# Patient Record
Sex: Male | Born: 1995 | Race: Black or African American | Hispanic: No | Marital: Single | Smoking: Never smoker
Health system: Southern US, Community
[De-identification: ages and names within clinical notes are randomized; demographics above are authoritative.]

## PROBLEM LIST (undated history)

## (undated) DIAGNOSIS — Z789 Other specified health status: Secondary | ICD-10-CM

## (undated) DIAGNOSIS — Z8489 Family history of other specified conditions: Secondary | ICD-10-CM

## (undated) HISTORY — PX: WISDOM TOOTH EXTRACTION: SHX21

---

## 2017-07-11 ENCOUNTER — Inpatient Hospital Stay (HOSPITAL_COMMUNITY)
Admission: EM | Admit: 2017-07-11 | Discharge: 2017-07-13 | DRG: 340 | Disposition: A | Discharge status: Home / Self Care | Payer: Self-pay | Attending: General Surgery | Admitting: General Surgery

## 2017-07-11 ENCOUNTER — Encounter (HOSPITAL_COMMUNITY): Admission: EM | Disposition: A | Discharge status: Home / Self Care | Payer: Self-pay | Source: Home / Self Care

## 2017-07-11 ENCOUNTER — Encounter (HOSPITAL_COMMUNITY): Payer: Self-pay | Admitting: *Deleted

## 2017-07-11 ENCOUNTER — Emergency Department (HOSPITAL_COMMUNITY): Payer: Self-pay | Admitting: Anesthesiology

## 2017-07-11 ENCOUNTER — Emergency Department (HOSPITAL_COMMUNITY): Payer: Self-pay

## 2017-07-11 DIAGNOSIS — R1031 Right lower quadrant pain: Secondary | ICD-10-CM

## 2017-07-11 DIAGNOSIS — K3532 Acute appendicitis with perforation and localized peritonitis, without abscess: Principal | ICD-10-CM | POA: Diagnosis present

## 2017-07-11 DIAGNOSIS — D729 Disorder of white blood cells, unspecified: Secondary | ICD-10-CM

## 2017-07-11 DIAGNOSIS — R509 Fever, unspecified: Secondary | ICD-10-CM

## 2017-07-11 DIAGNOSIS — D734 Cyst of spleen: Secondary | ICD-10-CM | POA: Diagnosis present

## 2017-07-11 DIAGNOSIS — R000 Tachycardia, unspecified: Secondary | ICD-10-CM | POA: Diagnosis present

## 2017-07-11 DIAGNOSIS — K3580 Unspecified acute appendicitis: Secondary | ICD-10-CM

## 2017-07-11 DIAGNOSIS — K3531 Acute appendicitis with localized peritonitis and gangrene, without perforation: Secondary | ICD-10-CM | POA: Diagnosis present

## 2017-07-11 DIAGNOSIS — R112 Nausea with vomiting, unspecified: Secondary | ICD-10-CM

## 2017-07-11 HISTORY — DX: Family history of other specified conditions: Z84.89

## 2017-07-11 HISTORY — PX: LAPAROSCOPIC APPENDECTOMY: SHX408

## 2017-07-11 HISTORY — DX: Other specified health status: Z78.9

## 2017-07-11 LAB — COMPREHENSIVE METABOLIC PANEL
ALT: 27 U/L (ref 17–63)
AST: 19 U/L (ref 15–41)
Albumin: 4.8 g/dL (ref 3.5–5.0)
Alkaline Phosphatase: 42 U/L (ref 38–126)
Anion gap: 11 (ref 5–15)
BUN: 9 mg/dL (ref 6–20)
CHLORIDE: 100 mmol/L — AB (ref 101–111)
CO2: 22 mmol/L (ref 22–32)
Calcium: 9.4 mg/dL (ref 8.9–10.3)
Creatinine, Ser: 0.79 mg/dL (ref 0.61–1.24)
Glucose, Bld: 133 mg/dL — ABNORMAL HIGH (ref 65–99)
POTASSIUM: 3.8 mmol/L (ref 3.5–5.1)
Sodium: 133 mmol/L — ABNORMAL LOW (ref 135–145)
Total Bilirubin: 2.4 mg/dL — ABNORMAL HIGH (ref 0.3–1.2)
Total Protein: 8 g/dL (ref 6.5–8.1)

## 2017-07-11 LAB — CBC WITH DIFFERENTIAL/PLATELET
Basophils Absolute: 0 10*3/uL (ref 0.0–0.1)
Basophils Relative: 0 %
Eosinophils Absolute: 0 10*3/uL (ref 0.0–0.7)
Eosinophils Relative: 0 %
HEMATOCRIT: 45.7 % (ref 39.0–52.0)
HEMOGLOBIN: 16 g/dL (ref 13.0–17.0)
LYMPHS PCT: 2 %
Lymphs Abs: 0.3 10*3/uL — ABNORMAL LOW (ref 0.7–4.0)
MCH: 29.6 pg (ref 26.0–34.0)
MCHC: 35 g/dL (ref 30.0–36.0)
MCV: 84.6 fL (ref 78.0–100.0)
Monocytes Absolute: 1.2 10*3/uL — ABNORMAL HIGH (ref 0.1–1.0)
Monocytes Relative: 7 %
NEUTROS PCT: 91 %
Neutro Abs: 14.8 10*3/uL — ABNORMAL HIGH (ref 1.7–7.7)
PLATELETS: 214 10*3/uL (ref 150–400)
RBC: 5.4 MIL/uL (ref 4.22–5.81)
RDW: 13 % (ref 11.5–15.5)
WBC: 16.3 10*3/uL — AB (ref 4.0–10.5)

## 2017-07-11 LAB — URINALYSIS, ROUTINE W REFLEX MICROSCOPIC
BILIRUBIN URINE: NEGATIVE
Bacteria, UA: NONE SEEN
GLUCOSE, UA: NEGATIVE mg/dL
Hgb urine dipstick: NEGATIVE
KETONES UR: 80 mg/dL — AB
Leukocytes, UA: NEGATIVE
Nitrite: NEGATIVE
PH: 5 (ref 5.0–8.0)
Protein, ur: 100 mg/dL — AB
SPECIFIC GRAVITY, URINE: 1.029 (ref 1.005–1.030)

## 2017-07-11 LAB — LIPASE, BLOOD: Lipase: 23 U/L (ref 11–51)

## 2017-07-11 LAB — I-STAT CG4 LACTIC ACID, ED: LACTIC ACID, VENOUS: 1.29 mmol/L (ref 0.5–1.9)

## 2017-07-11 SURGERY — APPENDECTOMY, LAPAROSCOPIC
Anesthesia: General | Site: Abdomen

## 2017-07-11 MED ORDER — ROCURONIUM BROMIDE 10 MG/ML (PF) SYRINGE
PREFILLED_SYRINGE | INTRAVENOUS | Status: DC | PRN
  Administered 2017-07-11: 70 mg via INTRAVENOUS
  Administered 2017-07-11: 20 mg via INTRAVENOUS
  Administered 2017-07-11: 10 mg via INTRAVENOUS
  Administered 2017-07-11: 20 mg via INTRAVENOUS

## 2017-07-11 MED ORDER — FENTANYL CITRATE (PF) 100 MCG/2ML IJ SOLN
INTRAMUSCULAR | Status: DC | PRN
  Administered 2017-07-11 (×2): 50 ug via INTRAVENOUS
  Administered 2017-07-11: 100 ug via INTRAVENOUS
  Administered 2017-07-11: 50 ug via INTRAVENOUS

## 2017-07-11 MED ORDER — SUGAMMADEX SODIUM 500 MG/5ML IV SOLN
INTRAVENOUS | Status: AC
  Filled 2017-07-11: qty 5

## 2017-07-11 MED ORDER — ENOXAPARIN SODIUM 40 MG/0.4ML ~~LOC~~ SOLN
40.0000 mg | SUBCUTANEOUS | Status: DC
  Administered 2017-07-12 – 2017-07-13 (×2): 40 mg via SUBCUTANEOUS
  Filled 2017-07-11 (×2): qty 0.4

## 2017-07-11 MED ORDER — PROPOFOL 10 MG/ML IV BOLUS
INTRAVENOUS | Status: DC | PRN
  Administered 2017-07-11: 200 mg via INTRAVENOUS

## 2017-07-11 MED ORDER — METRONIDAZOLE IN NACL 5-0.79 MG/ML-% IV SOLN
500.0000 mg | Freq: Three times a day (TID) | INTRAVENOUS | Status: DC
  Administered 2017-07-12 – 2017-07-13 (×5): 500 mg via INTRAVENOUS
  Filled 2017-07-11 (×5): qty 100

## 2017-07-11 MED ORDER — PROMETHAZINE HCL 25 MG/ML IJ SOLN
6.2500 mg | INTRAMUSCULAR | Status: DC | PRN
  Filled 2017-07-11: qty 1

## 2017-07-11 MED ORDER — HYDROMORPHONE HCL 1 MG/ML IJ SOLN: 0.2500 mg | INTRAMUSCULAR | Status: DC | PRN

## 2017-07-11 MED ORDER — ACETAMINOPHEN 10 MG/ML IV SOLN
INTRAVENOUS | Status: AC
  Filled 2017-07-11: qty 100

## 2017-07-11 MED ORDER — LIDOCAINE 2% (20 MG/ML) 5 ML SYRINGE
INTRAMUSCULAR | Status: AC
  Filled 2017-07-11: qty 5

## 2017-07-11 MED ORDER — LACTATED RINGERS IV SOLN
INTRAVENOUS | Status: DC | PRN
  Administered 2017-07-11 (×2): via INTRAVENOUS

## 2017-07-11 MED ORDER — ACETAMINOPHEN 325 MG PO TABS
650.0000 mg | ORAL_TABLET | Freq: Once | ORAL | Status: AC | PRN
  Administered 2017-07-11: 650 mg via ORAL
  Filled 2017-07-11 (×2): qty 2

## 2017-07-11 MED ORDER — ONDANSETRON HCL 4 MG/2ML IJ SOLN
4.0000 mg | Freq: Once | INTRAMUSCULAR | Status: AC
  Administered 2017-07-11: 4 mg via INTRAVENOUS
  Filled 2017-07-11: qty 2

## 2017-07-11 MED ORDER — SODIUM CHLORIDE 0.9 % IV SOLN
2.0000 g | INTRAVENOUS | Status: DC
  Administered 2017-07-12: 2 g via INTRAVENOUS
  Filled 2017-07-11: qty 2
  Filled 2017-07-11: qty 20

## 2017-07-11 MED ORDER — POTASSIUM CHLORIDE IN NACL 20-0.9 MEQ/L-% IV SOLN
INTRAVENOUS | Status: DC
  Administered 2017-07-12 (×2): via INTRAVENOUS
  Filled 2017-07-11 (×3): qty 1000

## 2017-07-11 MED ORDER — IOPAMIDOL (ISOVUE-300) INJECTION 61%
100.0000 mL | Freq: Once | INTRAVENOUS | Status: AC | PRN
  Administered 2017-07-11: 100 mL via INTRAVENOUS

## 2017-07-11 MED ORDER — LACTATED RINGERS IR SOLN
Status: DC | PRN
  Administered 2017-07-11: 1000 mL

## 2017-07-11 MED ORDER — ONDANSETRON 4 MG PO TBDP: 4.0000 mg | ORAL_TABLET | Freq: Four times a day (QID) | ORAL | Status: DC | PRN

## 2017-07-11 MED ORDER — KETOROLAC TROMETHAMINE 30 MG/ML IJ SOLN: 30.0000 mg | Freq: Once | INTRAMUSCULAR | Status: DC | PRN

## 2017-07-11 MED ORDER — MIDAZOLAM HCL 2 MG/2ML IJ SOLN
INTRAMUSCULAR | Status: AC
  Filled 2017-07-11: qty 2

## 2017-07-11 MED ORDER — BUPIVACAINE-EPINEPHRINE 0.5% -1:200000 IJ SOLN
INTRAMUSCULAR | Status: DC | PRN
  Administered 2017-07-11: 20 mL

## 2017-07-11 MED ORDER — MIDAZOLAM HCL 5 MG/5ML IJ SOLN
INTRAMUSCULAR | Status: DC | PRN
  Administered 2017-07-11: 2 mg via INTRAVENOUS

## 2017-07-11 MED ORDER — MORPHINE SULFATE (PF) 4 MG/ML IV SOLN
2.0000 mg | INTRAVENOUS | Status: DC | PRN
  Administered 2017-07-12: 2 mg via INTRAVENOUS
  Filled 2017-07-11: qty 1

## 2017-07-11 MED ORDER — HYDROMORPHONE HCL 2 MG/ML IJ SOLN
INTRAMUSCULAR | Status: AC
  Filled 2017-07-11: qty 1

## 2017-07-11 MED ORDER — MORPHINE SULFATE (PF) 4 MG/ML IV SOLN
4.0000 mg | Freq: Once | INTRAVENOUS | Status: AC
  Administered 2017-07-11: 4 mg via INTRAVENOUS
  Filled 2017-07-11: qty 1

## 2017-07-11 MED ORDER — PROPOFOL 10 MG/ML IV BOLUS
INTRAVENOUS | Status: AC
  Filled 2017-07-11: qty 20

## 2017-07-11 MED ORDER — ONDANSETRON HCL 4 MG/2ML IJ SOLN
INTRAMUSCULAR | Status: AC
  Filled 2017-07-11: qty 2

## 2017-07-11 MED ORDER — SUGAMMADEX SODIUM 200 MG/2ML IV SOLN
INTRAVENOUS | Status: DC | PRN
  Administered 2017-07-11: 300 mg via INTRAVENOUS

## 2017-07-11 MED ORDER — FENTANYL CITRATE (PF) 250 MCG/5ML IJ SOLN
INTRAMUSCULAR | Status: AC
  Filled 2017-07-11: qty 5

## 2017-07-11 MED ORDER — HYDROMORPHONE HCL 1 MG/ML IJ SOLN
INTRAMUSCULAR | Status: DC | PRN
  Administered 2017-07-11 (×5): 0.5 mg via INTRAVENOUS

## 2017-07-11 MED ORDER — ACETAMINOPHEN 10 MG/ML IV SOLN
1000.0000 mg | Freq: Once | INTRAVENOUS | Status: AC
  Administered 2017-07-11: 1000 mg via INTRAVENOUS

## 2017-07-11 MED ORDER — ONDANSETRON HCL 4 MG/2ML IJ SOLN
4.0000 mg | Freq: Four times a day (QID) | INTRAMUSCULAR | Status: DC | PRN
  Administered 2017-07-12 (×2): 4 mg via INTRAVENOUS
  Filled 2017-07-11 (×2): qty 2

## 2017-07-11 MED ORDER — DEXAMETHASONE SODIUM PHOSPHATE 10 MG/ML IJ SOLN
INTRAMUSCULAR | Status: DC | PRN
  Administered 2017-07-11: 10 mg via INTRAVENOUS

## 2017-07-11 MED ORDER — BUPIVACAINE-EPINEPHRINE (PF) 0.5% -1:200000 IJ SOLN
INTRAMUSCULAR | Status: AC
  Filled 2017-07-11: qty 30

## 2017-07-11 MED ORDER — ROCURONIUM BROMIDE 10 MG/ML (PF) SYRINGE
PREFILLED_SYRINGE | INTRAVENOUS | Status: AC
  Filled 2017-07-11: qty 10

## 2017-07-11 MED ORDER — SODIUM CHLORIDE 0.9 % IV SOLN
2.0000 g | Freq: Once | INTRAVENOUS | Status: AC
  Administered 2017-07-11: 2 g via INTRAVENOUS
  Filled 2017-07-11: qty 20

## 2017-07-11 MED ORDER — OXYCODONE-ACETAMINOPHEN 5-325 MG PO TABS
1.0000 | ORAL_TABLET | ORAL | Status: DC | PRN
  Administered 2017-07-12 (×2): 1 via ORAL
  Administered 2017-07-13 (×2): 2 via ORAL
  Filled 2017-07-11 (×3): qty 2
  Filled 2017-07-11: qty 1

## 2017-07-11 MED ORDER — ONDANSETRON HCL 4 MG/2ML IJ SOLN
INTRAMUSCULAR | Status: DC | PRN
  Administered 2017-07-11: 4 mg via INTRAVENOUS

## 2017-07-11 MED ORDER — IOPAMIDOL (ISOVUE-300) INJECTION 61%
INTRAVENOUS | Status: AC
  Filled 2017-07-11: qty 100

## 2017-07-11 MED ORDER — SODIUM CHLORIDE 0.9 % IV BOLUS
1000.0000 mL | Freq: Once | INTRAVENOUS | Status: AC
  Administered 2017-07-11: 1000 mL via INTRAVENOUS

## 2017-07-11 MED ORDER — METRONIDAZOLE IN NACL 5-0.79 MG/ML-% IV SOLN
500.0000 mg | Freq: Once | INTRAVENOUS | Status: AC
  Administered 2017-07-11: 500 mg via INTRAVENOUS
  Filled 2017-07-11: qty 100

## 2017-07-11 MED ORDER — DEXAMETHASONE SODIUM PHOSPHATE 10 MG/ML IJ SOLN
INTRAMUSCULAR | Status: AC
  Filled 2017-07-11: qty 1

## 2017-07-11 MED ORDER — 0.9 % SODIUM CHLORIDE (POUR BTL) OPTIME
TOPICAL | Status: DC | PRN
  Administered 2017-07-11: 1000 mL

## 2017-07-11 SURGICAL SUPPLY — 36 items
APPLIER CLIP 5 13 M/L LIGAMAX5 (MISCELLANEOUS)
APPLIER CLIP ROT 10 11.4 M/L (STAPLE)
CABLE HIGH FREQUENCY MONO STRZ (ELECTRODE) ×2 IMPLANT
CHLORAPREP W/TINT 26ML (MISCELLANEOUS) ×2 IMPLANT
CLIP APPLIE 5 13 M/L LIGAMAX5 (MISCELLANEOUS) IMPLANT
CLIP APPLIE ROT 10 11.4 M/L (STAPLE) IMPLANT
COVER SURGICAL LIGHT HANDLE (MISCELLANEOUS) ×2 IMPLANT
CUTTER FLEX LINEAR 45M (STAPLE) ×2 IMPLANT
DECANTER SPIKE VIAL GLASS SM (MISCELLANEOUS) ×2 IMPLANT
DERMABOND ADVANCED (GAUZE/BANDAGES/DRESSINGS) ×2
DERMABOND ADVANCED .7 DNX12 (GAUZE/BANDAGES/DRESSINGS) ×2 IMPLANT
DRAIN CHANNEL RND F F (WOUND CARE) ×2 IMPLANT
DRAPE LAPAROSCOPIC ABDOMINAL (DRAPES) ×2 IMPLANT
ELECT REM PT RETURN 15FT ADLT (MISCELLANEOUS) ×2 IMPLANT
EVACUATOR SILICONE 100CC (DRAIN) ×2 IMPLANT
GLOVE BIOGEL PI IND STRL 7.5 (GLOVE) ×1 IMPLANT
GLOVE BIOGEL PI INDICATOR 7.5 (GLOVE) ×1
GLOVE ECLIPSE 7.5 STRL STRAW (GLOVE) ×2 IMPLANT
GOWN STRL REUS W/TWL XL LVL3 (GOWN DISPOSABLE) ×4 IMPLANT
KIT BASIN OR (CUSTOM PROCEDURE TRAY) ×2 IMPLANT
PAD POSITIONING PINK XL (MISCELLANEOUS) ×2 IMPLANT
POUCH SPECIMEN RETRIEVAL 10MM (ENDOMECHANICALS) ×2 IMPLANT
RELOAD 45 VASCULAR/THIN (ENDOMECHANICALS) IMPLANT
RELOAD STAPLE TA45 3.5 REG BLU (ENDOMECHANICALS) ×2 IMPLANT
SCISSORS LAP 5X35 DISP (ENDOMECHANICALS) ×2 IMPLANT
SET IRRIG TUBING LAPAROSCOPIC (IRRIGATION / IRRIGATOR) ×2 IMPLANT
SHEARS HARMONIC ACE PLUS 36CM (ENDOMECHANICALS) ×2 IMPLANT
SLEEVE XCEL OPT CAN 5 100 (ENDOMECHANICALS) ×2 IMPLANT
SUT ETHILON 2 0 PS N (SUTURE) ×2 IMPLANT
SUT MNCRL AB 4-0 PS2 18 (SUTURE) ×2 IMPLANT
TOWEL OR 17X26 10 PK STRL BLUE (TOWEL DISPOSABLE) ×2 IMPLANT
TRAY FOLEY MTR SLVR 16FR STAT (SET/KITS/TRAYS/PACK) ×2 IMPLANT
TRAY LAPAROSCOPIC (CUSTOM PROCEDURE TRAY) ×2 IMPLANT
TROCAR BLADELESS OPT 5 100 (ENDOMECHANICALS) ×2 IMPLANT
TROCAR XCEL BLUNT TIP 100MML (ENDOMECHANICALS) ×2 IMPLANT
TUBING INSUF HEATED (TUBING) ×2 IMPLANT

## 2017-07-11 NOTE — ED Triage Notes (Signed)
Pt complains of lower abd pain, emesis x 2 days. Pt states he felt sick after being around a sick coworker. Pt denies diarrhea.

## 2017-07-11 NOTE — ED Notes (Signed)
Patient transported to CT 

## 2017-07-11 NOTE — Transfer of Care (Signed)
Immediate Anesthesia Transfer of Care Note  Patient: Shaun Webb  Procedure(s) Performed: Procedure(s): APPENDECTOMY LAPAROSCOPIC (N/A)  Patient Location: PACU  Anesthesia Type:General  Level of Consciousness:  sedated, patient cooperative and responds to stimulation  Airway & Oxygen Therapy:Patient Spontanous Breathing and Patient connected to face mask oxgen  Post-op Assessment:  Report given to PACU RN and Post -op Vital signs reviewed and stable  Post vital signs:  Reviewed and stable  Last Vitals:  Vitals:   07/11/17 1922 07/11/17 2241  BP: 121/68 (!) 155/8  Pulse: (!) 134 (!) 112  Resp: 14 17  Temp:  (!) 38.4 C  SpO2: 94% 99%    Complications: No apparent anesthesia complications

## 2017-07-11 NOTE — Anesthesia Procedure Notes (Signed)
Procedure Name: Intubation Date/Time: 07/11/2017 8:33 PM Performed by: Anne Fu, CRNA Pre-anesthesia Checklist: Patient identified, Emergency Drugs available, Suction available, Patient being monitored and Timeout performed Patient Re-evaluated:Patient Re-evaluated prior to induction Oxygen Delivery Method: Circle system utilized Preoxygenation: Pre-oxygenation with 100% oxygen Induction Type: IV induction Ventilation: Mask ventilation without difficulty Laryngoscope Size: Mac and 4 Grade View: Grade II Tube type: Oral Tube size: 7.5 mm Number of attempts: 1 Airway Equipment and Method: Stylet Placement Confirmation: ETT inserted through vocal cords under direct vision,  positive ETCO2 and breath sounds checked- equal and bilateral Secured at: 22 cm Tube secured with: Tape Dental Injury: Teeth and Oropharynx as per pre-operative assessment

## 2017-07-11 NOTE — H&P (Signed)
Shaun Webb is an 22 y.o. male.   Chief Complaint: RLQ pain HPI: Shaun Webb is a 22 y.o. male with a PMHx of ADHD, who presents to the ED with complaints of abdominal pain that began Saturday night.  Patient states that initially his abdominal pain wasmore centralized but then started migrating into the RLQ.  Pain worsens with movement.  He has had associated subjective fever, chills, nausea and emesis.  His only past surgery was wisdom teeth removal, otherwise he has had no surgeries.  He takes no medications regularly.  He has no known allergies.  He has not eaten any food since Saturday, he had small sips of water upon arrival but has not really had much since Saturday night.  He denies any prior abd surgeries.     Past Medical History:  Diagnosis Date  . Family history of adverse reaction to anesthesia    mom had nausea, vomiting, severe ha  . Medical history non-contributory     Past Surgical History:  Procedure Laterality Date  . WISDOM TOOTH EXTRACTION      History reviewed. No pertinent family history. Social History:  reports that he has never smoked. He has never used smokeless tobacco. He reports that he does not drink alcohol or use drugs.  Allergies: No Known Allergies   (Not in a hospital admission)  Results for orders placed or performed during the hospital encounter of 07/11/17 (from the past 48 hour(s))  Urinalysis, Routine w reflex microscopic     Status: Abnormal   Collection Time: 07/11/17  1:48 PM  Result Value Ref Range   Color, Urine AMBER (A) YELLOW    Comment: BIOCHEMICALS MAY BE AFFECTED BY COLOR   APPearance HAZY (A) CLEAR   Specific Gravity, Urine 1.029 1.005 - 1.030   pH 5.0 5.0 - 8.0   Glucose, UA NEGATIVE NEGATIVE mg/dL   Hgb urine dipstick NEGATIVE NEGATIVE   Bilirubin Urine NEGATIVE NEGATIVE   Ketones, ur 80 (A) NEGATIVE mg/dL   Protein, ur 100 (A) NEGATIVE mg/dL   Nitrite NEGATIVE NEGATIVE   Leukocytes, UA NEGATIVE NEGATIVE   RBC /  HPF 0-5 0 - 5 RBC/hpf   WBC, UA 0-5 0 - 5 WBC/hpf   Bacteria, UA NONE SEEN NONE SEEN   Squamous Epithelial / LPF 0-5 0 - 5   Mucus PRESENT     Comment: Performed at [facility], [place] [address], [place], [place] [address]  Comprehensive metabolic panel     Status: Abnormal   Collection Time: 07/11/17  2:01 PM  Result Value Ref Range   Sodium 133 (L) 135 - 145 mmol/L   Potassium 3.8 3.5 - 5.1 mmol/L   Chloride 100 (L) 101 - 111 mmol/L   CO2 22 22 - 32 mmol/L   Glucose, Bld 133 (H) 65 - 99 mg/dL   BUN 9 6 - 20 mg/dL   Creatinine, Ser 0.79 0.61 - 1.24 mg/dL   Calcium 9.4 8.9 - 10.3 mg/dL   Total Protein 8.0 6.5 - 8.1 g/dL   Albumin 4.8 3.5 - 5.0 g/dL   AST 19 15 - 41 U/L   ALT 27 17 - 63 U/L   Alkaline Phosphatase 42 38 - 126 U/L   Total Bilirubin 2.4 (H) 0.3 - 1.2 mg/dL   GFR calc non Af Amer >60 >60 mL/min   GFR calc Af Amer >60 >60 mL/min    Comment: (NOTE) The eGFR has been calculated using the CKD EPI equation. This calculation  has not been validated in all clinical situations. eGFR's persistently <60 mL/min signify possible Chronic Kidney Disease.    Anion gap 11 5 - 15    Comment: Performed at [facility], [place] [address] [place], [place] [address]  CBC with Differential     Status: Abnormal   Collection Time: 07/11/17  2:01 PM  Result Value Ref Range   WBC 16.3 (H) 4.0 - 10.5 K/uL   RBC 5.40 4.22 - 5.81 MIL/uL   Hemoglobin 16.0 13.0 - 17.0 g/dL   HCT 45.7 39.0 - 52.0 %   MCV 84.6 78.0 - 100.0 fL   MCH 29.6 26.0 - 34.0 pg   MCHC 35.0 30.0 - 36.0 g/dL   RDW 13.0 11.5 - 15.5 %   Platelets 214 150 - 400 K/uL   Neutrophils Relative % 91 %   Neutro Abs 14.8 (H) 1.7 - 7.7 K/uL   Lymphocytes Relative 2 %   Lymphs Abs 0.3 (L) 0.7 - 4.0 K/uL   Monocytes Relative 7 %   Monocytes Absolute 1.2 (H) 0.1 - 1.0 K/uL   Eosinophils Relative 0 %   Eosinophils Absolute 0.0 0.0 - 0.7 K/uL   Basophils Relative 0 %   Basophils Absolute 0.0 0.0  - 0.1 K/uL    Comment: Performed at [facility], [place] [address], [place], [place] [address]  Lipase, blood     Status: None   Collection Time: 07/11/17  2:01 PM  Result Value Ref Range   Lipase 23 11 - 51 U/L    Comment: Performed at [facility], [place] [address] [place], [place] [address]  I-Stat CG4 Lactic Acid, ED     Status: None   Collection Time: 07/11/17  2:04 PM  Result Value Ref Range   Lactic Acid, Venous 1.29 0.5 - 1.9 mmol/L   Ct Abdomen Pelvis W Contrast  Result Date: 07/11/2017 CLINICAL DATA:  Lower abdominal pain for 2 days EXAM: CT ABDOMEN AND PELVIS WITH CONTRAST TECHNIQUE: Multidetector CT imaging of the abdomen and pelvis was performed using the standard protocol following bolus administration of intravenous contrast. CONTRAST:  130m ISOVUE-300 IOPAMIDOL (ISOVUE-300) INJECTION 61% COMPARISON:  None. FINDINGS: Lower chest: No acute abnormality. Hepatobiliary: Diffuse fatty infiltration of the liver is noted. The gallbladder is well distended. Pancreas: Unremarkable. No pancreatic ductal dilatation or surrounding inflammatory changes. Spleen: Large cyst is noted within the spleen which measures 9.6 cm in greatest dimension. It has a simple appearance. Adrenals/Urinary Tract: Adrenal glands are unremarkable. Kidneys are normal, without renal calculi, focal lesion, or hydronephrosis. Bladder is unremarkable. Stomach/Bowel: Significant inflammatory changes are noted surrounding the appendix and cecum. The appendix is dilated to 13 mm consistent with acute appendicitis. Slight increased density is noted within the appendiceal lumen consistent with inspissated barium. No definitive appendicolith is seen. Scattered surrounding small lymph nodes are noted. Appendix: Location: Retrocecal Diameter: 13 mm Appendicolith: Absent Mucosal hyper-enhancement: Present Extraluminal gas: Absent Periappendiceal collection: Absent Vascular/Lymphatic: No significant  vascular findings are present. No enlarged abdominal or pelvic lymph nodes. Reproductive: Prostate is unremarkable. Other: No abdominal wall hernia or abnormality. No abdominopelvic ascites. Musculoskeletal: No acute or significant osseous findings. IMPRESSION: Changes consistent with acute appendicitis as described above. Large splenic cyst. Fatty infiltration of the liver. Electronically Signed   By: MInez CatalinaM.D.   On: 07/11/2017 15:53    Review of Systems  Constitutional: Positive for chills and fever.  HENT: Negative for hearing loss.   Eyes: Negative  for blurred vision.  Respiratory: Negative for cough and shortness of breath.   Cardiovascular: Negative for chest pain and palpitations.  Gastrointestinal: Positive for abdominal pain, nausea and vomiting. Negative for constipation and diarrhea.  Genitourinary: Negative for dysuria, frequency and urgency.  Skin: Negative for itching and rash.  Neurological: Negative for dizziness and headaches.    Blood pressure 125/68, pulse (!) 135, temperature (S) (!) 102 F (38.9 C), temperature source Oral, resp. rate (!) 22, SpO2 97 %. Physical Exam  Constitutional: He is oriented to person, place, and time. He appears well-developed and well-nourished.  HENT:  Head: Normocephalic and atraumatic.  Eyes: Pupils are equal, round, and reactive to light. Conjunctivae and EOM are normal.  Neck: Normal range of motion. Neck supple.  Cardiovascular: Regular rhythm.  Respiratory: Effort normal. No respiratory distress.  GI: Soft. There is tenderness (RLQ).  Musculoskeletal: Normal range of motion.  Neurological: He is alert and oriented to person, place, and time.  Skin: Skin is warm and dry.     Assessment/Plan 22 y.o. M with acute appendicitis.  IV antibiotics given.  Plan for OR later this evening.    Rosario Adie., MD 5/39/1225, 5:04 PM

## 2017-07-11 NOTE — ED Notes (Signed)
ED Provider at bedside. 

## 2017-07-11 NOTE — ED Provider Notes (Signed)
[facility]-EMERGENCY DEPT Provider Note   CSN: [id number] Arrival date & time: 07/11/17  1209     History   Chief Complaint Chief Complaint  Patient presents with  . Abdominal Pain  . Emesis    HPI Shaun Webb is a 22 y.o. male with a PMHx of ADHD, who presents to the ED with complaints of abdominal pain that began Saturday night, 2 days ago.  Patient states that initially his abdominal pain was somewhat periumbilical and more generalized but then started migrating into the RLQ, describing it as a 6/10 constant sharp waxing and waning nonradiating RLQ pain that worsens with movement and has been minimally improved with Tylenol and Pepto-Bismol.  He last took Tylenol at 7 AM, and then was given a dose upon arrival.  He has had associated subjective fever, chills, nausea, and greater than 10 episodes daily of nonbloody nonbilious emesis.  He has a few coworkers that are sick with somewhat similar symptoms.  His only past surgery was wisdom teeth removal, otherwise he has had no surgeries.  He takes no medications regularly.  He has no known allergies.  He has not eaten any food since Saturday, he had small sips of water upon arrival but has not really had much since Saturday night.  He denies CP, SOB, diarrhea/constipation, obstipation, melena, hematochezia, hematemesis, hematuria, dysuria, myalgias, arthralgias, numbness, tingling, focal weakness, or any other complaints at this time. Denies recent travel, suspicious food intake, EtOH use, frequent NSAID use, or prior abd surgeries.   The history is provided by the patient and medical records. No language interpreter was used.  Abdominal Pain   Associated symptoms include fever, nausea and vomiting. Pertinent negatives include diarrhea, constipation, dysuria, hematuria, arthralgias and myalgias.  Emesis   Associated symptoms include abdominal pain, chills and a fever. Pertinent negatives include no arthralgias, no  diarrhea and no myalgias.    History reviewed. No pertinent past medical history.  There are no active problems to display for this patient.    Home Medications    Prior to Admission medications   Not on File    Family History No family history on file.  Social History Social History   Tobacco Use  . Smoking status: Not on file  Substance Use Topics  . Alcohol use: Not on file  . Drug use: Not on file     Allergies   Patient has no known allergies.   Review of Systems Review of Systems  Constitutional: Positive for chills and fever.  Respiratory: Negative for shortness of breath.   Cardiovascular: Negative for chest pain.  Gastrointestinal: Positive for abdominal pain, nausea and vomiting. Negative for blood in stool, constipation and diarrhea.  Genitourinary: Negative for dysuria and hematuria.  Musculoskeletal: Negative for arthralgias and myalgias.  Skin: Negative for color change.  Allergic/Immunologic: Negative for immunocompromised state.  Neurological: Negative for weakness and numbness.  Psychiatric/Behavioral: Negative for confusion.   All other systems reviewed and are negative for acute change except as noted in the HPI.    Physical Exam Updated Vital Signs BP (!) 145/88 (BP Location: Left Arm)   Pulse (!) 135   Temp (!) 103.2 F (39.6 C) (Oral)   Resp 20   SpO2 97%   Physical Exam  Constitutional: He is oriented to person, place, and time. He appears well-developed and well-nourished.  Non-toxic appearance. No distress.  Febrile to 103.2, but nontoxic appearing and not septic appearing, actually looks fairly well and is in NAD  HENT:  Head: Normocephalic and atraumatic.  Mouth/Throat: Oropharynx is clear and moist. Mucous membranes are dry.  Lips slightly dry  Eyes: Conjunctivae and EOM are normal. Right eye exhibits no discharge. Left eye exhibits no discharge.  Neck: Normal range of motion. Neck supple.  Cardiovascular: Regular rhythm,  normal heart sounds and intact distal pulses. Tachycardia present. Exam reveals no gallop and no friction rub.  No murmur heard. Tachycardic in the 120-130s  Pulmonary/Chest: Effort normal and breath sounds normal. No respiratory distress. He has no decreased breath sounds. He has no wheezes. He has no rhonchi. He has no rales.  Abdominal: Soft. Normal appearance. He exhibits no distension. Bowel sounds are decreased. There is tenderness in the right lower quadrant. There is guarding and tenderness at McBurney's point. There is no rigidity, no rebound, no CVA tenderness and negative Murphy's sign.  Soft, obese but nondistended, slightly hypoactive bowel sounds but overall still present in all 4 quadrants, with moderate RLQ TTP over mcburney's point with voluntary guarding, no rebound/rigidity, neg murphy's, no CVA TTP. +Rovsing's, neg psoas sign, neg foot tap test.   Musculoskeletal: Normal range of motion.  Neurological: He is alert and oriented to person, place, and time. He has normal strength. No sensory deficit.  Skin: Skin is warm, dry and intact. No rash noted.  Psychiatric: He has a normal mood and affect.  Nursing note and vitals reviewed.    ED Treatments / Results  Labs (all labs ordered are listed, but only abnormal results are displayed) Labs Reviewed  COMPREHENSIVE METABOLIC PANEL - Abnormal; Notable for the following components:      Result Value   Sodium 133 (*)    Chloride 100 (*)    Glucose, Bld 133 (*)    Total Bilirubin 2.4 (*)    All other components within normal limits  CBC WITH DIFFERENTIAL/PLATELET - Abnormal; Notable for the following components:   WBC 16.3 (*)    Neutro Abs 14.8 (*)    Lymphs Abs 0.3 (*)    Monocytes Absolute 1.2 (*)    All other components within normal limits  URINALYSIS, ROUTINE W REFLEX MICROSCOPIC - Abnormal; Notable for the following components:   Color, Urine AMBER (*)    APPearance HAZY (*)    Ketones, ur 80 (*)    Protein, ur 100  (*)    All other components within normal limits  LIPASE, BLOOD  I-STAT CG4 LACTIC ACID, ED    EKG None  Radiology Ct Abdomen Pelvis W Contrast  Result Date: 07/11/2017 CLINICAL DATA:  Lower abdominal pain for 2 days EXAM: CT ABDOMEN AND PELVIS WITH CONTRAST TECHNIQUE: Multidetector CT imaging of the abdomen and pelvis was performed using the standard protocol following bolus administration of intravenous contrast. CONTRAST:  ISOVUE-300 IOPAMIDOL (ISOVUE-300) INJECTION 61% COMPARISON:  None. FINDINGS: Lower chest: No acute abnormality. Hepatobiliary: Diffuse fatty infiltration of the liver is noted. The gallbladder is well distended. Pancreas: Unremarkable. No pancreatic ductal dilatation or surrounding inflammatory changes. Spleen: Large cyst is noted within the spleen which measures 9.6 cm in greatest dimension. It has a simple appearance. Adrenals/Urinary Tract: Adrenal glands are unremarkable. Kidneys are normal, without renal calculi, focal lesion, or hydronephrosis. Bladder is unremarkable. Stomach/Bowel: Significant inflammatory changes are noted surrounding the appendix and cecum. The appendix is dilated to 13 mm consistent with acute appendicitis. Slight increased density is noted within the appendiceal lumen consistent with inspissated barium. No definitive appendicolith is seen. Scattered surrounding small lymph nodes are noted. Appendix:  Location: Retrocecal Diameter: 13 mm Appendicolith: Absent Mucosal hyper-enhancement: Present Extraluminal gas: Absent Periappendiceal collection: Absent Vascular/Lymphatic: No significant vascular findings are present. No enlarged abdominal or pelvic lymph nodes. Reproductive: Prostate is unremarkable. Other: No abdominal wall hernia or abnormality. No abdominopelvic ascites. Musculoskeletal: No acute or significant osseous findings. IMPRESSION: Changes consistent with acute appendicitis as described above. Large splenic cyst. Fatty infiltration of the  liver. Electronically Signed   By: Alcide Clever M.D.   On: 07/11/2017 15:53    Procedures Procedures (including critical care time)  Medications Ordered in ED Medications  iopamidol (ISOVUE-300) 61 % injection (has no administration in time range)  cefTRIAXone (ROCEPHIN) 2 g in sodium chloride 0.9 % 100 mL IVPB (2 g Intravenous New Bag/Given 07/11/17 1610)    And  metroNIDAZOLE (FLAGYL) IVPB 500 mg (500 mg Intravenous New Bag/Given 07/11/17 1625)  acetaminophen (TYLENOL) tablet 650 mg (650 mg Oral Given 07/11/17 1327)  sodium chloride 0.9 % bolus 1,000 mL (0 mLs Intravenous Stopped 07/11/17 1437)  ondansetron (ZOFRAN) injection 4 mg (4 mg Intravenous Given 07/11/17 1432)  morphine 4 MG/ML injection 4 mg (4 mg Intravenous Given 07/11/17 1432)  sodium chloride 0.9 % bolus 1,000 mL (0 mLs Intravenous Stopped 07/11/17 1532)  iopamidol (ISOVUE-300) 61 % injection 100 mL (100 mLs Intravenous Contrast Given 07/11/17 1521)     Initial Impression / Assessment and Plan / ED Course  I have reviewed the triage vital signs and the nursing notes.  Pertinent labs & imaging results that were available during my care of the patient were reviewed by me and considered in my medical decision making (see chart for details).     22 y.o. male here with RLQ pain and n/v x2 days. Pain was initially periumbilical then became RLQ. On exam, febrile to 103.2, tachycardic in the 120-130s, but actually fairly well appearing and not septic appearing at all, abdomen soft with slightly hypoactive BS, focal tenderness at mcburney's point with slight voluntary guarding but no rebound/rigidity. Will get labs and CT abd/pelv, will hold off on calling code sepsis as pt actually is very well appearing, and I would like to confirm the dx of likely appendicitis before deciding on abx; will give fluids, pain/nausea meds, then reassess shortly.   4:03 PM CBC w/diff with neutrophilic leukocytosis as expected. CMP with mildly elevated  Tbili 2.4 likely from mild dehydration, otherwise fairly unremarkable/unconcerning. Lipase WNL. Lactic negative. U/A with protein and ketones likely from dehydration, but no evidence of UTI. CT A/P confirming acute appendicitis, no notable abscess; also shows large splenic cyst unclear etiology of this. Will start abx for appendicitis and consult surgeon. Discussed case with my attending Dr. Rubin Payor who agrees with plan.   4:36 PM Dr. Maisie Fus of [facility] returning page and will admit. Holding orders to be placed by admitting team. Please see their notes for further documentation of care. I appreciate their help with this pleasant pt's care. Pt stable at time of admission.    Final Clinical Impressions(s) / ED Diagnoses   Final diagnoses:  Acute appendicitis, unspecified acute appendicitis type  RLQ abdominal pain  Nausea and vomiting in adult patient  Fever, unspecified fever cause  Neutrophilic leukocytosis  Splenic cyst    ED Discharge Orders    [address] [place], [place] 07/11/17 1636    Benjiman Core, MD 07/12/17 [phone]

## 2017-07-11 NOTE — Op Note (Signed)
Preoperative Diagnosis: Acute appendicitis  Postoprative Diagnosis: Acute appendicitis with gangrene and perforation  Procedure: Procedure(s): APPENDECTOMY LAPAROSCOPIC   Surgeon: Glenna Fellows T   Assistants: None  Anesthesia:  General endotracheal anesthesia  Indications: 22 year old male presents with 2 days of worsening right lower quadrant abdominal pain with nausea and vomiting.  He has leukocytosis, localized tenderness and CT scan showing acute appendicitis without apparent complication.  We recommended proceeding with laparoscopic appendectomy.  After discussion regarding the surgery and indications and risks he agrees to proceed this brought to the operating room for this procedure.    Procedure Detail: Patient had received broad-spectrum IV antibiotics.  He was taken to the operating room, placed in the supine position on the operating table, and general endotracheal anesthesia induced.  Foley catheter was placed.  PAS were in place.  The abdomen was widely sterilely prepped and draped.  Patient timeout was performed and correct procedure verified.  Access was obtained with a 1-1/2 cm incision at the umbilicus with an open Hassan cannula through mattress suture of 0 Vicryl and pneumoperitoneum established.  Under direct vision 5 mm trochars were placed in the epigastrium and left lower quadrant.  There was obvious inflammation in the right lower quadrant with induration and edema and omentum adherent over the cecum and terminal ileum.  There were some chronic adhesions up over the liver of uncertain etiology.  The omentum was elevated up out of the pelvis and dissected away from inflammatory adhesions to the cecum and terminal ileum.  In doing this I unroofed an area of necrotic tissue with some purulence and fecaliths lying against the wall of the cecum anterior laterally consistent with a necrotic perforated appendix.  I then in order to expose and define the anatomy mobilized the  terminal ileum and cecum dividing lateral peritoneal attachments with full mobilization.  With careful mostly blunt dissection I was able to identify the process is a gangrenous perforated appendix and working back toward the tip of the cecum there was about a centimeter and a half of viable intact appendix.  The perforation was about a centimeter and a half from the base and it essentially divided the appendix and 2.  I was able to free up the base of the appendix dividing a couple of vascular attachments and mobilizing inflammatory attachments until I could elevate this with reasonably healthy tissue at the tip of the cecum and this was divided with a single firing of the blue load GIA stapler.  The small piece of tissue was removed.  I then was able to grasp the appendix more distally at the necrotic area and just beyond this and using mostly blunt dissection the appendix was separated from severe inflammatory attachments to the wall of the cecum working out laterally toward the pelvic sidewall.  The mesoappendix could be thinned out underneath this was divided with the harmonic scalpel until I dissected out to soft normal fatty tissue which appeared to be beyond the tip of the appendix and the whole specimen was freed and placed in an Endo Catch bag.  This was brought out through the umbilical incision.  The abdomen was then irrigated with several liters of warm saline.  There was no bleeding.  The staple line at the tip of the cecum was intact.  There was minimal contamination.  However due to the fairly discrete abscess cavity and acute inflammation lateral to the cecum I left a closed suction drain 19 Blake in the site brought out through the  lower abdominal 5 mm trocar site.  The [facility] trocar was removed and the mattress suture secured at all CO2 evacuated.  Skin incisions were closed with subcuticular Monocryl and Dermabond.  Sponge needle and instrument counts were correct.    Findings: Acute  appendicitis with gangrene and perforation  Estimated Blood Loss:  less than 50 mL         Drains: 19 Blake drain in right lower quadrant  Blood Given: none          Specimens: Appendix        Complications:  * No complications entered in OR log *         Disposition: PACU - hemodynamically stable.         Condition: stable

## 2017-07-11 NOTE — Anesthesia Preprocedure Evaluation (Signed)
Anesthesia Evaluation  Patient identified by MRN, date of birth, ID band Patient awake    Reviewed: Allergy & Precautions, NPO status , Patient's Chart, lab work & pertinent test results  Airway Mallampati: II  TM Distance: >3 FB Neck ROM: Full    Dental no notable dental hx.    Pulmonary neg pulmonary ROS,    Pulmonary exam normal breath sounds clear to auscultation       Cardiovascular negative cardio ROS Normal cardiovascular exam Rhythm:Regular Rate:Normal     Neuro/Psych negative neurological ROS  negative psych ROS   GI/Hepatic negative GI ROS, Neg liver ROS,   Endo/Other  negative endocrine ROS  Renal/GU negative Renal ROS  negative genitourinary   Musculoskeletal negative musculoskeletal ROS (+)   Abdominal   Peds negative pediatric ROS (+)  Hematology negative hematology ROS (+)   Anesthesia Other Findings   Reproductive/Obstetrics negative OB ROS                             Anesthesia Physical Anesthesia Plan  ASA: I  Anesthesia Plan: General   Post-op Pain Management:    Induction: Intravenous and Rapid sequence  PONV Risk Score and Plan: 2 and Ondansetron, Dexamethasone and Treatment may vary due to age or medical condition  Airway Management Planned: Oral ETT  Additional Equipment:   Intra-op Plan:   Post-operative Plan: Extubation in OR  Informed Consent: I have reviewed the patients History and Physical, chart, labs and discussed the procedure including the risks, benefits and alternatives for the proposed anesthesia with the patient or authorized representative who has indicated his/her understanding and acceptance.     Dental advisory given  Plan Discussed with: CRNA and Surgeon  Anesthesia Plan Comments:         Anesthesia Quick Evaluation  

## 2017-07-12 ENCOUNTER — Other Ambulatory Visit: Payer: Self-pay

## 2017-07-12 ENCOUNTER — Encounter (HOSPITAL_COMMUNITY): Payer: Self-pay | Admitting: General Surgery

## 2017-07-12 LAB — CBC
HCT: 43.4 % (ref 39.0–52.0)
HEMOGLOBIN: 14.2 g/dL (ref 13.0–17.0)
MCH: 28.9 pg (ref 26.0–34.0)
MCHC: 32.7 g/dL (ref 30.0–36.0)
MCV: 88.2 fL (ref 78.0–100.0)
Platelets: 182 10*3/uL (ref 150–400)
RBC: 4.92 MIL/uL (ref 4.22–5.81)
RDW: 13.2 % (ref 11.5–15.5)
WBC: 14.4 10*3/uL — ABNORMAL HIGH (ref 4.0–10.5)

## 2017-07-12 NOTE — Progress Notes (Addendum)
Patient ID: Shaun Webb, male   DOB: [birth year], 22 y.o.   MRN: [id number]    1 Day Post-Op  Subjective: Patient feels ok today.  He is sore as expected.  Has had some clear liquids, but no tray yet.  States he is passing some flatus.  Objective: Vital signs in last 24 hours: Temp:  [98.3 F (36.8 C)-103.2 F (39.6 C)] 99 F (37.2 C) (05/21 0515) Pulse Rate:  [98-135] 103 (05/21 0515) Resp:  [14-46] 18 (05/21 0515) BP: (108-155)/(8-91) 124/74 (05/21 0515) SpO2:  [94 %-100 %] 97 % (05/21 0515) Weight:  [142.2 kg (313 lb 7.9 oz)] 142.2 kg (313 lb 7.9 oz) (05/21 0019) Last BM Date: 07/11/17  Intake/Output from previous day: 05/20 0701 - 05/21 0700 In: 3406.7 [P.O.:720; I.V.:2486.7; IV Piggyback:200] Out: 1365 [Urine:900; Drains:440; Blood:25] Intake/Output this shift: No intake/output data recorded.  PE: Abd: soft, obese, appropriately tender, incisions are c/d/i.  JP drain present with some serosang output. Heart: mildly tachy Lungs: CTAB  Lab Results:  Recent Labs    07/11/17 1401 07/12/17 0411  WBC 16.3* 14.4*  HGB 16.0 14.2  HCT 45.7 43.4  PLT 214 182   BMET Recent Labs    07/11/17 1401  NA 133*  K 3.8  CL 100*  CO2 22  GLUCOSE 133*  BUN 9  CREATININE 0.79  CALCIUM 9.4   PT/INR No results for input(s): LABPROT, INR in the last 72 hours. CMP     Component Value Date/Time   NA 133 (L) 07/11/2017 1401   K 3.8 07/11/2017 1401   CL 100 (L) 07/11/2017 1401   CO2 22 07/11/2017 1401   GLUCOSE 133 (H) 07/11/2017 1401   BUN 9 07/11/2017 1401   CREATININE 0.79 07/11/2017 1401   CALCIUM 9.4 07/11/2017 1401   PROT 8.0 07/11/2017 1401   ALBUMIN 4.8 07/11/2017 1401   AST 19 07/11/2017 1401   ALT 27 07/11/2017 1401   ALKPHOS 42 07/11/2017 1401   BILITOT 2.4 (H) 07/11/2017 1401   GFRNONAA >60 07/11/2017 1401   GFRAA >60 07/11/2017 1401   Lipase     Component Value Date/Time   LIPASE 23 07/11/2017 1401       Studies/Results: Ct Abdomen Pelvis  W Contrast  Result Date: 07/11/2017 CLINICAL DATA:  Lower abdominal pain for 2 days EXAM: CT ABDOMEN AND PELVIS WITH CONTRAST TECHNIQUE: Multidetector CT imaging of the abdomen and pelvis was performed using the standard protocol following bolus administration of intravenous contrast. CONTRAST:  ISOVUE-300 IOPAMIDOL (ISOVUE-300) INJECTION 61% COMPARISON:  None. FINDINGS: Lower chest: No acute abnormality. Hepatobiliary: Diffuse fatty infiltration of the liver is noted. The gallbladder is well distended. Pancreas: Unremarkable. No pancreatic ductal dilatation or surrounding inflammatory changes. Spleen: Large cyst is noted within the spleen which measures 9.6 cm in greatest dimension. It has a simple appearance. Adrenals/Urinary Tract: Adrenal glands are unremarkable. Kidneys are normal, without renal calculi, focal lesion, or hydronephrosis. Bladder is unremarkable. Stomach/Bowel: Significant inflammatory changes are noted surrounding the appendix and cecum. The appendix is dilated to 13 mm consistent with acute appendicitis. Slight increased density is noted within the appendiceal lumen consistent with inspissated barium. No definitive appendicolith is seen. Scattered surrounding small lymph nodes are noted. Appendix: Location: Retrocecal Diameter: 13 mm Appendicolith: Absent Mucosal hyper-enhancement: Present Extraluminal gas: Absent Periappendiceal collection: Absent Vascular/Lymphatic: No significant vascular findings are present. No enlarged abdominal or pelvic lymph nodes. Reproductive: Prostate is unremarkable. Other: No abdominal wall hernia or abnormality. No abdominopelvic ascites. Musculoskeletal: No  acute or significant osseous findings. IMPRESSION: Changes consistent with acute appendicitis as described above. Large splenic cyst. Fatty infiltration of the liver. Electronically Signed   By: Alcide Clever M.D.   On: 07/11/2017 15:53    Anti-infectives: Anti-infectives (From admission, onward)     Start     Dose/Rate Route Frequency Ordered Stop   07/12/17 1600  cefTRIAXone (ROCEPHIN) 2 g in sodium chloride 0.9 % 100 mL IVPB     2 g 200 mL/hr over 30 Minutes Intravenous Every 24 hours 07/11/17 2349     07/11/17 2349  metroNIDAZOLE (FLAGYL) IVPB 500 mg     500 mg 100 mL/hr over 60 Minutes Intravenous Every 8 hours 07/11/17 2349     07/11/17 1545  cefTRIAXone (ROCEPHIN) 2 g in sodium chloride 0.9 % 100 mL IVPB     2 g 200 mL/hr over 30 Minutes Intravenous  Once 07/11/17 1538 07/11/17 1655   07/11/17 1545  metroNIDAZOLE (FLAGYL) IVPB 500 mg     500 mg 100 mL/hr over 60 Minutes Intravenous  Once 07/11/17 1538 07/11/17 1753       Assessment/Plan Perforated gangrenous appendicitis, POD 1, s/p lap appy with drain placement  -clear liquids today, likely adv to soft diet tomorrow -mobilize and pulm toilet today.  Taught him how to use his IS and he is pulling between [phone]. -cont JP drain for now -Rocephin/flagyl, cont for now.  Will need a total of 7 days of abx therapy. -follow up CBC looks good.  WBC down to 14 from 16K  FEN - clear liquids/IVFs to be DC when tolerating clears VTE - Lovenox/SCDs ID - Rocephin/Flagyl 5/20 -->   LOS: 1 day    Letha Cape , [facility] 07/12/2017, 8:39 AM Pager: [phone]

## 2017-07-13 MED ORDER — AMOXICILLIN-POT CLAVULANATE 875-125 MG PO TABS: 1.0000 | ORAL_TABLET | Freq: Two times a day (BID) | ORAL | 0 refills | Status: AC

## 2017-07-13 MED ORDER — OXYCODONE HCL 5 MG PO TABS: 5.0000 mg | ORAL_TABLET | Freq: Four times a day (QID) | ORAL | 0 refills | Status: AC | PRN

## 2017-07-13 MED ORDER — ACETAMINOPHEN 500 MG PO TABS: 1000.0000 mg | ORAL_TABLET | Freq: Four times a day (QID) | ORAL | 0 refills | Status: AC | PRN

## 2017-07-13 NOTE — Discharge Summary (Addendum)
     Patient ID: Shaun Webb [id number] 06/14/1995 22 y.o.  Admit date: 07/11/2017 Discharge date: 07/13/2017  Admitting Diagnosis: Acute perforated appendicits  Discharge Diagnosis Patient Active Problem List   Diagnosis Date Noted  . Acute perforated appendicitis 07/11/2017    Consultants none  Reason for Admission: Shaun Webb is a 22 y.o. male with a PMHx of ADHD, who presents to the ED with complaints of abdominal pain that began Saturday night. Patient states that initially his abdominal pain wasmore centralized but then started migrating into the RLQ.  Pain worsens with movement.  He has had associated subjective fever, chills, nausea and emesis. His only past surgery was wisdom teeth removal, otherwise he has had no surgeries. He takes no medications regularly. He has no known allergies. He has not eaten any food since Saturday, he had small sips of water upon arrival but has not really had much since Saturday night. He denies any prior abd surgeries.   Procedures Lap appy with drain placement on 07-11-17 by Dr. [facility] Course:  The patient was admitted and placed on Rocephin and Flagyl.  He was taken to the OR where he underwent the above procedure.  abx were continued post-operatively.  He was placed on clear liquids and his diet was advanced.  He was having bowel function by POD 2 with gas and BMs.  He was not nauseated.  His JP drain was slightly cloudy on POD 1, but had cleared up to just serosanguineous type drainage by POD 2.  He will be discharged with this drain in place.  He will receive a total of 7 days of abx therapy. On 07/13/17 the patients pain was controlled, tolerating oral intake, mobilizing, and medically stable for discharge home. Follow up as below. The patients questions were answered prior to discharge.   Allergies as of 07/13/2017   No Known Allergies     Medication List    TAKE these medications   acetaminophen 500 MG  tablet Commonly known as:  TYLENOL Take 2 tablets (1,000 mg total) by mouth every 6 (six) hours as needed for up to 5 days.   amoxicillin-clavulanate 875-125 MG tablet Commonly known as:  AUGMENTIN Take 1 tablet by mouth 2 (two) times daily for 5 days.   oxyCODONE 5 MG immediate release tablet Commonly known as:  ROXICODONE Take 1-2 tablets (5-10 mg total) by mouth every 6 (six) hours as needed.        Follow-up Information    [facility], [facility] Follow up on 07/19/2017.   Specialty:  General Surgery Why:  2:00pm, arrive by 1:45pm for nurse only visit for JP drain removal Contact information: [address] [place] [address] [phone]        Hedda Slade, PA-C Follow up on 08/02/2017.   Specialty:  General Surgery Why:  8:30am, arrive by 8:15am for check in.  please bring insurance card and photo ID Contact information: [address] [place] [address] [phone]           Signed: Barnetta Chapel, [facility] 07/13/2017, 8:49 AM Pager: [phone]  Hosie Spangle, [facility] Pager: [phone] Consults: [phone] Mon-Fri 7:00 am-4:30 pm Sat-Sun 7:00 am-11:30 am

## 2017-07-13 NOTE — Progress Notes (Signed)
Instructed patient how to empty Al Pimple drain and recharge. Also instructed him to record the amount each time. Patient able to verbalize back correct process for all of the above. Lina Sar, RN

## 2017-07-13 NOTE — Discharge Instructions (Signed)
Your appointment is at 2:30 on 5-28 for drain removal, please arrive at least 30 min before your appointment to complete your check in paperwork.  If you are unable to arrive 30 min prior to your appointment time we may have to cancel or reschedule you.  LAPAROSCOPIC SURGERY: POST OP INSTRUCTIONS  1. DIET: Follow a light bland diet the first 24 hours after arrival home, such as soup, liquids, crackers, etc. Be sure to include lots of fluids daily. Avoid fast food or heavy meals as your are more likely to get nauseated. Eat a low fat the next few days after surgery.  2. Take your usually prescribed home medications unless otherwise directed. 3. PAIN CONTROL:  1. Pain is best controlled by a usual combination of three different methods TOGETHER:  i. Ice/Heat ii. Over the counter pain medication iii. Prescription pain medication 2. Most patients will experience some swelling and bruising around the incisions. Ice packs or heating pads (30-60 minutes up to 6 times a day) will help. Use ice for the first few days to help decrease swelling and bruising, then switch to heat to help relax tight/sore spots and speed recovery. Some people prefer to use ice alone, heat alone, alternating between ice & heat. Experiment to what works for you. Swelling and bruising can take several weeks to resolve.  3. It is helpful to take an over-the-counter pain medication regularly for the first few weeks. Choose one of the following that works best for you:  i. Naproxen (Aleve, etc) Two  tabs twice a day ii. Ibuprofen (Advil, etc) Three  tabs four times a day (every meal & bedtime) iii. Acetaminophen (Tylenol, etc) 500-650mg  four times a day (every meal & bedtime) 4. A prescription for pain medication (such as oxycodone, hydrocodone, etc) should be given to you upon discharge. Take your pain medication as prescribed.  i. If you are having problems/concerns with the prescription medicine (does not control pain,  nausea, vomiting, rash, itching, etc), please call us [phone] to see if we need to switch you to a different pain medicine that will work better for you and/or control your side effect better. ii. If you need a refill on your pain medication, please contact your pharmacy. They will contact our office to request authorization. Prescriptions will not be filled after 5 pm or on week-ends. 1. Avoid getting constipated. Between the surgery and the pain medications, it is common to experience some constipation. Increasing fluid intake and taking a fiber supplement (such as Metamucil, Citrucel, FiberCon, MiraLax, etc) 1-2 times a day regularly will usually help prevent this problem from occurring. A mild laxative (prune juice, Milk of Magnesia, MiraLax, etc) should be taken according to package directions if there are no bowel movements after 48 hours.  2. Watch out for diarrhea. If you have many loose bowel movements, simplify your diet to bland foods & liquids for a few days. Stop any stool softeners and decrease your fiber supplement. Switching to mild anti-diarrheal medications (Kayopectate, Pepto Bismol) can help. If this worsens or does not improve, please call us. 3. Wash / shower every day. You may shower over the dressings as they are waterproof. Continue to shower over incision(s) after the dressing is off. 4. Remove your waterproof bandages 5 days after surgery. You may leave the incision open to air. You may replace a dressing/Band-Aid to cover the incision for comfort if you wish.  5. ACTIVITIES as tolerated:  a. You may resume regular (light) daily activities  beginning the next day--such as daily self-care, walking, climbing stairs--gradually increasing activities as tolerated. If you can walk 30 minutes without difficulty, it is safe to try more intense activity such as jogging, treadmill, bicycling, low-impact aerobics, swimming, etc. b. Save the most intensive and strenuous activity for last  such as sit-ups, heavy lifting, contact sports, etc Refrain from any heavy lifting or straining until you are off narcotics for pain control.  c. DO NOT PUSH THROUGH PAIN. Let pain be your guide: If it hurts to do something, don't do it. Pain is your body warning you to avoid that activity for another week until the pain goes down. d. You may drive when you are no longer taking prescription pain medication, you can comfortably wear a seatbelt, and you can safely maneuver your car and apply brakes. e. Bonita Quin may have sexual intercourse when it is comfortable.  6. FOLLOW UP in our office  a. Please call [facility] at [phone] to set up an appointment to see your surgeon in the office for a follow-up appointment approximately 2-3 weeks after your surgery. b. Make sure that you call for this appointment the day you arrive home to insure a convenient appointment time.      10. IF YOU HAVE DISABILITY OR FAMILY LEAVE FORMS, BRING THEM TO THE               OFFICE FOR PROCESSING.   WHEN TO CALL us [phone]:  1. Poor pain control 2. Reactions / problems with new medications (rash/itching, nausea, etc)  3. Fever over 101.5 F (38.5 C) 4. Inability to urinate 5. Nausea and/or vomiting 6. Worsening swelling or bruising 7. Continued bleeding from incision. 8. Increased pain, redness, or drainage from the incision  The clinic staff is available to answer your questions during regular business hours (8:30am-5pm). Please dont hesitate to call and ask to speak to one of our nurses for clinical concerns.  If you have a medical emergency, go to the nearest emergency room or call 911.  A surgeon from [facility] is always on call at the [facility], [place]  [address], [address], [place], [place] [address] ?  MAIN: [phone] ? TOLL FREE: [phone] ?  FAX [phone]  [email or web address]

## 2017-07-13 NOTE — Progress Notes (Signed)
Patient ID: Shaun Webb, male   DOB: [birth year], 22 y.o.   MRN: [id number]    2 Days Post-Op  Subjective: Patient feels well.  Sore, but otherwise no complaints.  Tolerating clear liquids with no nausea.  + flatus and BM.  Ambulating.  Objective: Vital signs in last 24 hours: Temp:  [99 F (37.2 C)-100.8 F (38.2 C)] 99.4 F (37.4 C) (05/22 0553) Pulse Rate:  [105-121] 119 (05/22 0553) Resp:  [16-18] 18 (05/22 0553) BP: (131-147)/(72-97) 131/81 (05/22 0553) SpO2:  [94 %-98 %] 94 % (05/22 0553) Last BM Date: 07/12/17  Intake/Output from previous day: 05/21 0701 - 05/22 0700 In: 3703.3 [P.O.:1220; I.V.:2383.3; IV Piggyback:100] Out: 1545 [Urine:1450; Drains:95] Intake/Output this shift: No intake/output data recorded.  PE: Heart: regular, but mildly tach Lungs: CTAB Abd: soft, appropriately tender, JP has cleared up and has minimal serosang drainage. +BS, incisions are c/d/i.    Lab Results:  Recent Labs    07/11/17 1401 07/12/17 0411  WBC 16.3* 14.4*  HGB 16.0 14.2  HCT 45.7 43.4  PLT 214 182   BMET Recent Labs    07/11/17 1401  NA 133*  K 3.8  CL 100*  CO2 22  GLUCOSE 133*  BUN 9  CREATININE 0.79  CALCIUM 9.4   PT/INR No results for input(s): LABPROT, INR in the last 72 hours. CMP     Component Value Date/Time   NA 133 (L) 07/11/2017 1401   K 3.8 07/11/2017 1401   CL 100 (L) 07/11/2017 1401   CO2 22 07/11/2017 1401   GLUCOSE 133 (H) 07/11/2017 1401   BUN 9 07/11/2017 1401   CREATININE 0.79 07/11/2017 1401   CALCIUM 9.4 07/11/2017 1401   PROT 8.0 07/11/2017 1401   ALBUMIN 4.8 07/11/2017 1401   AST 19 07/11/2017 1401   ALT 27 07/11/2017 1401   ALKPHOS 42 07/11/2017 1401   BILITOT 2.4 (H) 07/11/2017 1401   GFRNONAA >60 07/11/2017 1401   GFRAA >60 07/11/2017 1401   Lipase     Component Value Date/Time   LIPASE 23 07/11/2017 1401       Studies/Results: Ct Abdomen Pelvis W Contrast  Result Date: 07/11/2017 CLINICAL DATA:  Lower  abdominal pain for 2 days EXAM: CT ABDOMEN AND PELVIS WITH CONTRAST TECHNIQUE: Multidetector CT imaging of the abdomen and pelvis was performed using the standard protocol following bolus administration of intravenous contrast. CONTRAST:  ISOVUE-300 IOPAMIDOL (ISOVUE-300) INJECTION 61% COMPARISON:  None. FINDINGS: Lower chest: No acute abnormality. Hepatobiliary: Diffuse fatty infiltration of the liver is noted. The gallbladder is well distended. Pancreas: Unremarkable. No pancreatic ductal dilatation or surrounding inflammatory changes. Spleen: Large cyst is noted within the spleen which measures 9.6 cm in greatest dimension. It has a simple appearance. Adrenals/Urinary Tract: Adrenal glands are unremarkable. Kidneys are normal, without renal calculi, focal lesion, or hydronephrosis. Bladder is unremarkable. Stomach/Bowel: Significant inflammatory changes are noted surrounding the appendix and cecum. The appendix is dilated to 13 mm consistent with acute appendicitis. Slight increased density is noted within the appendiceal lumen consistent with inspissated barium. No definitive appendicolith is seen. Scattered surrounding small lymph nodes are noted. Appendix: Location: Retrocecal Diameter: 13 mm Appendicolith: Absent Mucosal hyper-enhancement: Present Extraluminal gas: Absent Periappendiceal collection: Absent Vascular/Lymphatic: No significant vascular findings are present. No enlarged abdominal or pelvic lymph nodes. Reproductive: Prostate is unremarkable. Other: No abdominal wall hernia or abnormality. No abdominopelvic ascites. Musculoskeletal: No acute or significant osseous findings. IMPRESSION: Changes consistent with acute appendicitis as described above. Large splenic  cyst. Fatty infiltration of the liver. Electronically Signed   By: Alcide Clever M.D.   On: 07/11/2017 15:53    Anti-infectives: Anti-infectives (From admission, onward)   Start     Dose/Rate Route Frequency Ordered Stop    07/13/17 0000  amoxicillin-clavulanate (AUGMENTIN) 875-125 MG tablet     1 tablet Oral 2 times daily 07/13/17 0847 07/18/17 2359   07/12/17 1600  cefTRIAXone (ROCEPHIN) 2 g in sodium chloride 0.9 % 100 mL IVPB     2 g 200 mL/hr over 30 Minutes Intravenous Every 24 hours 07/11/17 2349     07/11/17 2349  metroNIDAZOLE (FLAGYL) IVPB 500 mg     500 mg 100 mL/hr over 60 Minutes Intravenous Every 8 hours 07/11/17 2349     07/11/17 1545  cefTRIAXone (ROCEPHIN) 2 g in sodium chloride 0.9 % 100 mL IVPB     2 g 200 mL/hr over 30 Minutes Intravenous  Once 07/11/17 1538 07/11/17 1655   07/11/17 1545  metroNIDAZOLE (FLAGYL) IVPB 500 mg     500 mg 100 mL/hr over 60 Minutes Intravenous  Once 07/11/17 1538 07/11/17 1753       Assessment/Plan Perforated gangrenous appendicitis, POD 2, s/p lap appy with drain placement  -soft diet -mobilize and pulm toilet today.  -cont JP drain for now -Rocephin/flagyl, cont for now.  Will need a total of 7 days of abx therapy. -still with some mild tachycardia and low grade fever overnight of 100.8.  He clinically looks good and suspect these will only improve.  Still likely ok for DC home later today, but will have Dr. Maisie Fus evaluate for her opinion.  FEN - soft diet/ SLIV VTE - Lovenox/SCDs ID - Rocephin/Flagyl 5/20 -->   LOS: 2 days    Letha Cape , [facility] 07/13/2017, 8:51 AM Pager: [phone]

## 2017-07-13 NOTE — Anesthesia Postprocedure Evaluation (Signed)
Anesthesia Post Note  Patient: Shaun Webb  Procedure(s) Performed: APPENDECTOMY LAPAROSCOPIC (N/A Abdomen)     Patient location during evaluation: PACU Anesthesia Type: General Level of consciousness: awake and alert Pain management: pain level controlled Vital Signs Assessment: post-procedure vital signs reviewed and stable Respiratory status: spontaneous breathing, nonlabored ventilation, respiratory function stable and patient connected to nasal cannula oxygen Cardiovascular status: blood pressure returned to baseline and stable Postop Assessment: no apparent nausea or vomiting Anesthetic complications: no    Last Vitals:  Vitals:   07/13/17 0122 07/13/17 0553  BP: (!) 147/97 131/81  Pulse: (!) 121 (!) 119  Resp: 18 18  Temp: 37.2 C 37.4 C  SpO2: 98% 94%    Last Pain:  Vitals:   07/13/17 0553  TempSrc: Oral  PainSc:                  [name] S

## 2019-06-03 IMAGING — CT CT ABD-PELV W/ CM
2 of 4 series · 16 of 46 positions shown, 18 images · IV contrast (ISOVUE)
Comparison: None.

CLINICAL DATA: Lower abdominal pain for 2 days

EXAM:
CT ABDOMEN AND PELVIS WITH CONTRAST
TECHNIQUE: Multidetector CT imaging of the abdomen and pelvis was performed
using the standard protocol following bolus administration of
intravenous contrast.
CONTRAST:  100mL [id number] IOPAMIDOL (CWTH7D-P66) INJECTION 61%

[Series 2: axial st · axial · 0.74mm/px · z∈[-204,+252]mm · 13 of 103 slices shown, 15 images]
[im 6/103  soft-tissue]
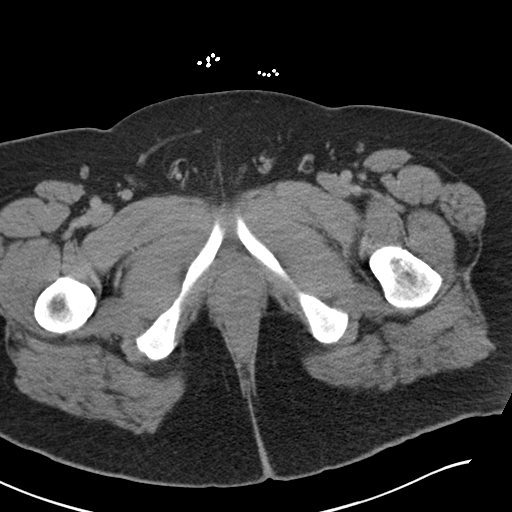
[im 6/103  bone]
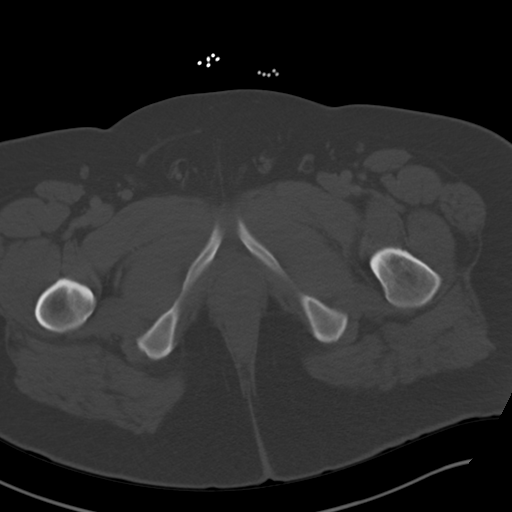
[im 12/103  soft-tissue]
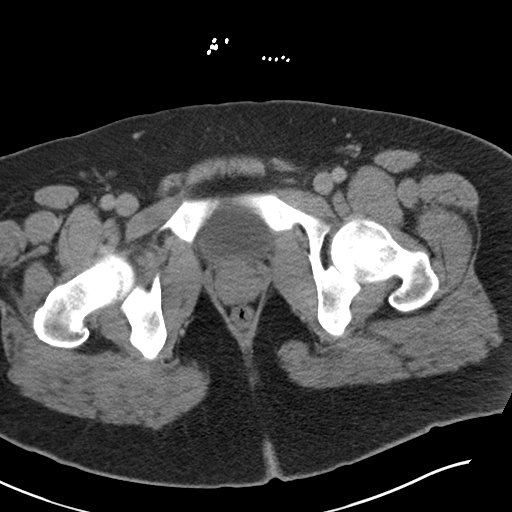
[im 23/103  soft-tissue]
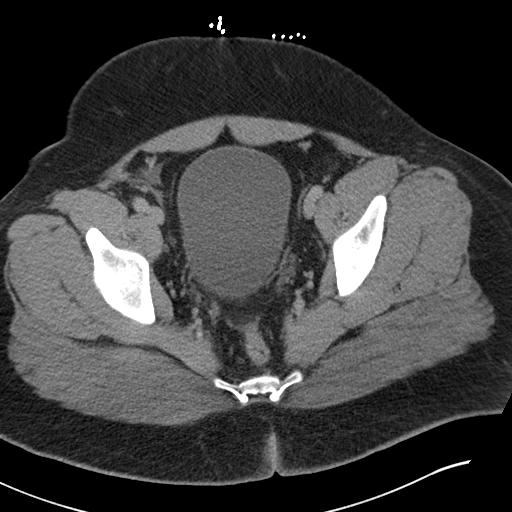
[im 29/103  soft-tissue]
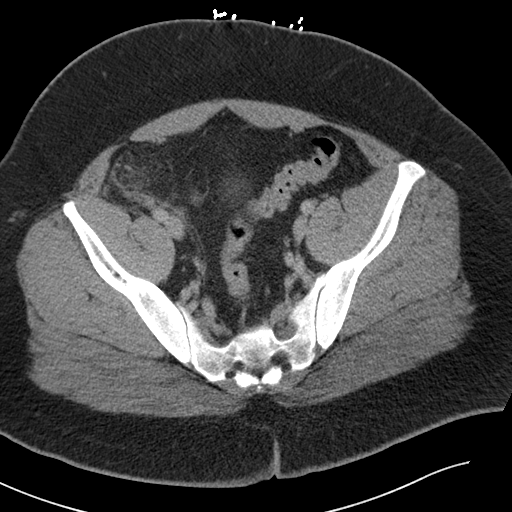
[im 35/103  soft-tissue]
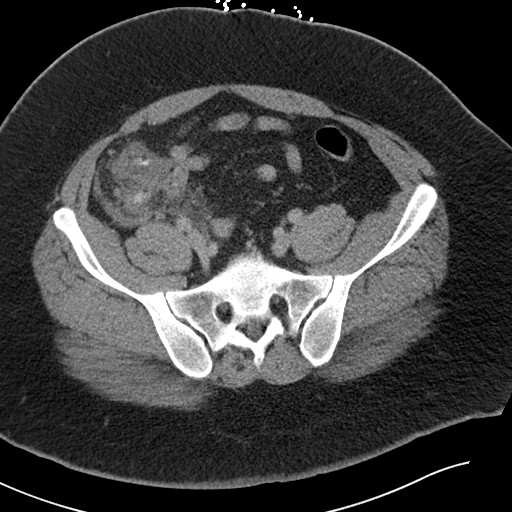
[im 46/103  soft-tissue]
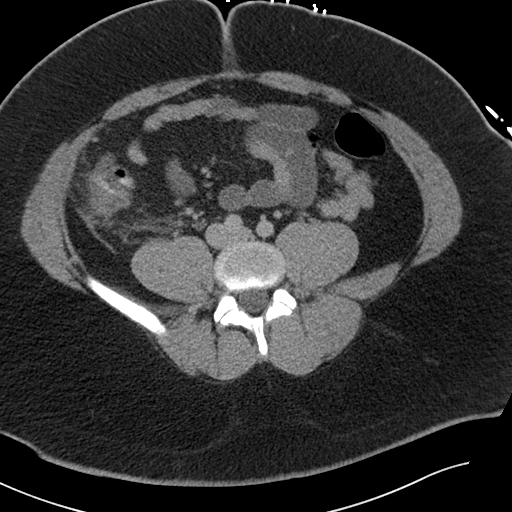
[im 52/103  soft-tissue]
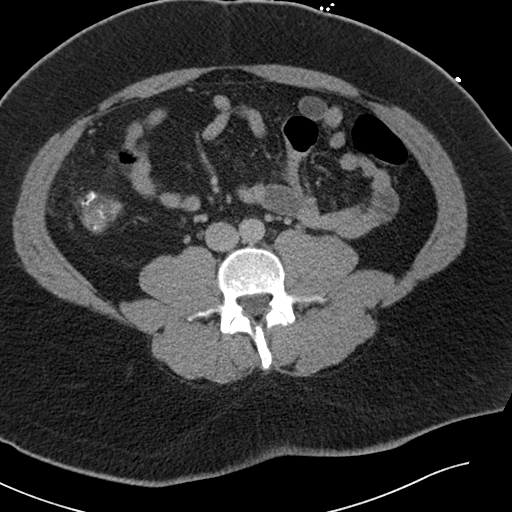
[im 57/103  soft-tissue]
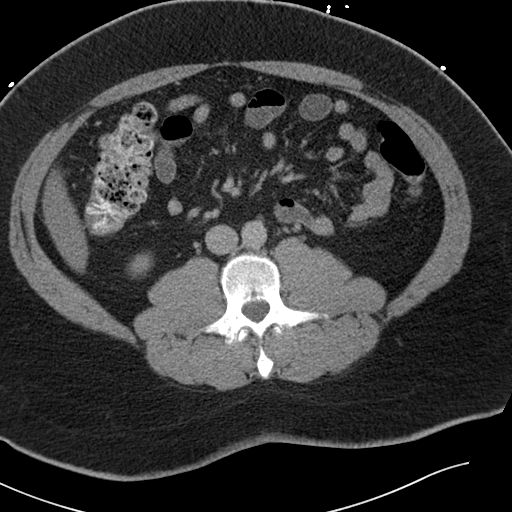
[im 69/103  soft-tissue]
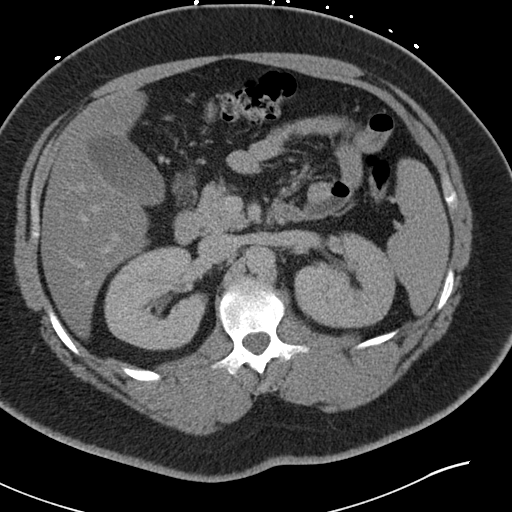
[im 69/103  bone]
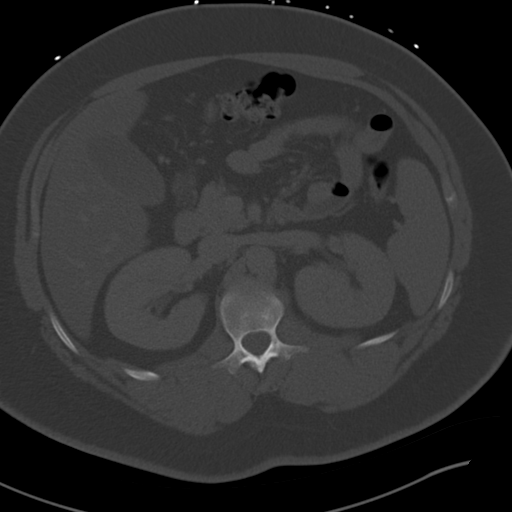
[im 74/103  soft-tissue]
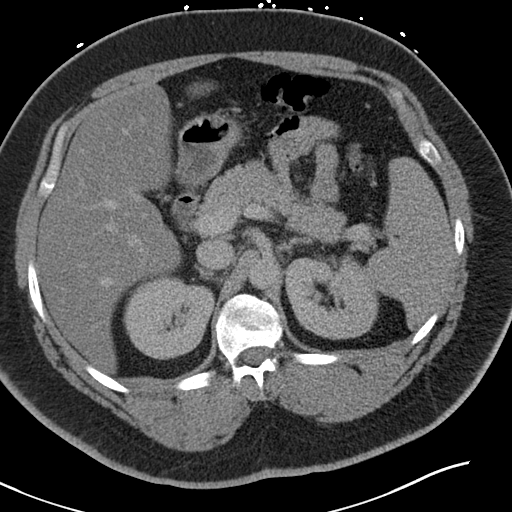
[im 80/103  soft-tissue]
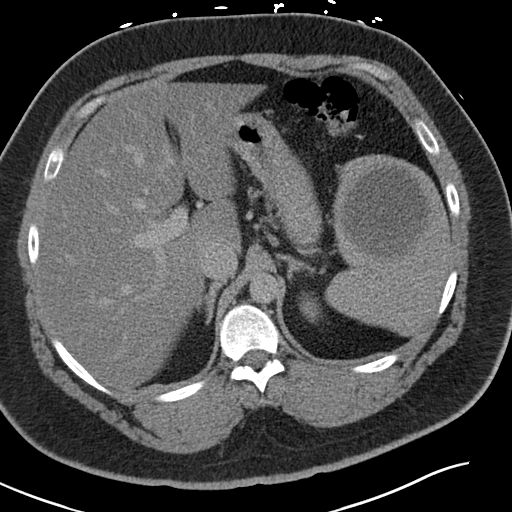
[im 91/103  soft-tissue]
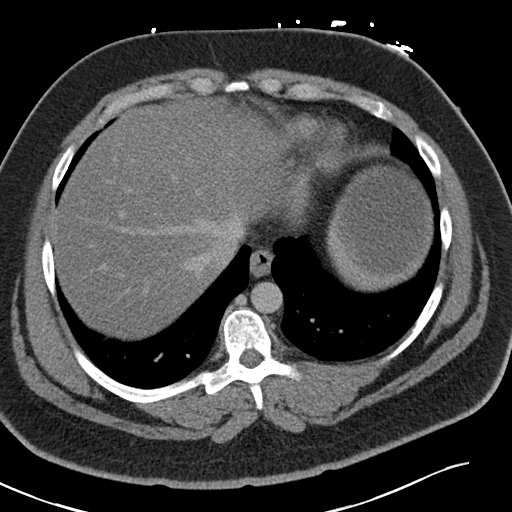
[im 97/103  soft-tissue]
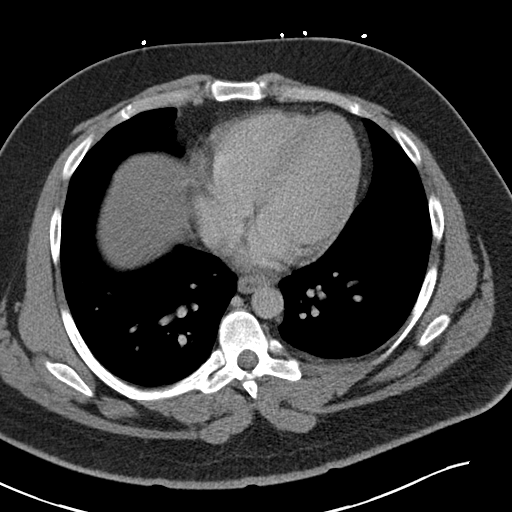

[Series 4: coronal st · coronal · 0.83mm/px · 3 of 101 slices shown]
[im 34/101  soft-tissue]
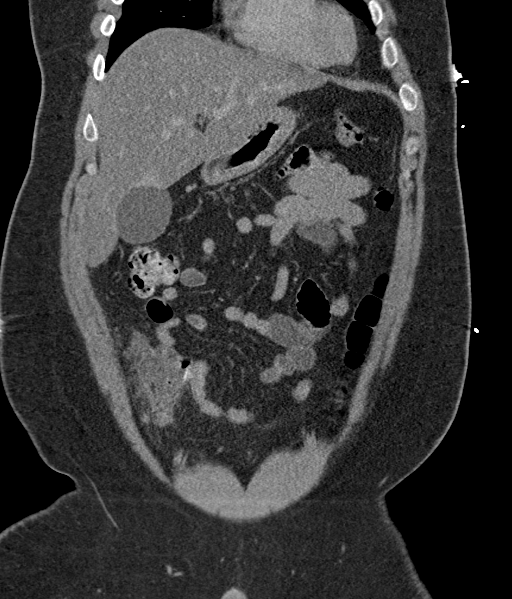
[im 45/101  soft-tissue]
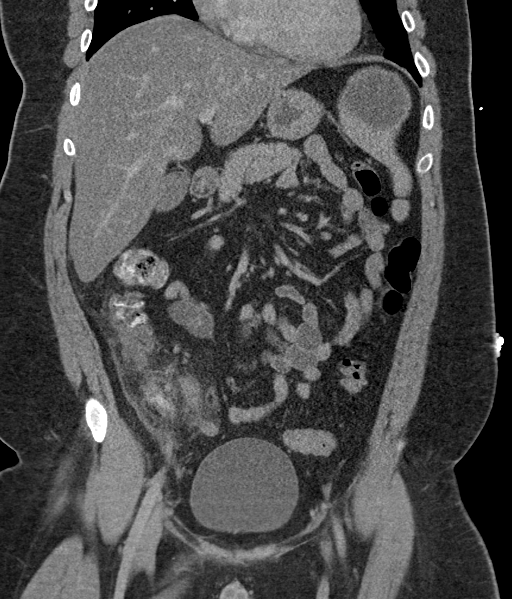
[im 56/101  soft-tissue]
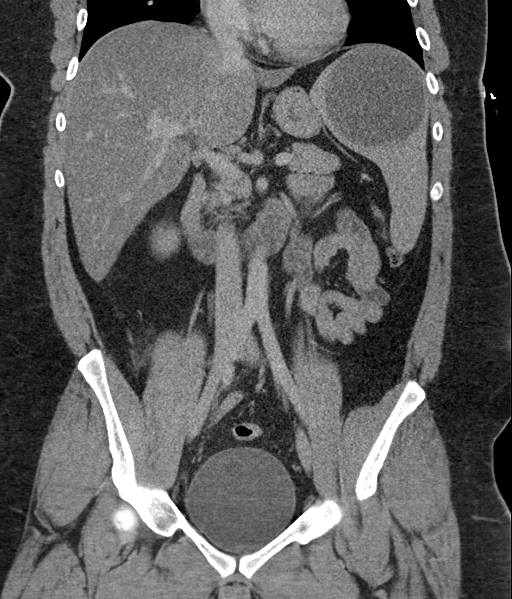

[16 of 46 positions shown; findings below may reference images not displayed]

FINDINGS: Lower chest: No acute abnormality.

Hepatobiliary: Diffuse fatty infiltration of the liver is noted. The
gallbladder is well distended.

Pancreas: Unremarkable. No pancreatic ductal dilatation or
surrounding inflammatory changes.

Spleen: Large cyst is noted within the spleen which measures 9.6 cm
in greatest dimension. It has a simple appearance.

Adrenals/Urinary Tract: Adrenal glands are unremarkable. Kidneys are
normal, without renal calculi, focal lesion, or hydronephrosis.
Bladder is unremarkable.

Stomach/Bowel: Significant inflammatory changes are noted
surrounding the appendix and cecum. The appendix is dilated to 13 mm
consistent with acute appendicitis. Slight increased density is
noted within the appendiceal lumen consistent with inspissated
barium. No definitive appendicolith is seen. Scattered surrounding
small lymph nodes are noted.

Appendix: Location: Retrocecal

Diameter: 13 mm

Appendicolith: Absent

Mucosal hyper-enhancement: Present

Extraluminal gas: Absent

Periappendiceal collection: Absent

Vascular/Lymphatic: No significant vascular findings are present. No
enlarged abdominal or pelvic lymph nodes.

Reproductive: Prostate is unremarkable.

Other: No abdominal wall hernia or abnormality. No abdominopelvic
ascites.

Musculoskeletal: No acute or significant osseous findings.
IMPRESSION: Changes consistent with acute appendicitis as described above.

Large splenic cyst.

Fatty infiltration of the liver.
